# Patient Record
Sex: Male | Born: 2005 | Hispanic: Yes | Marital: Single | Smoking: Never smoker
Health system: Southern US, Community
[De-identification: ages and names within clinical notes are randomized; demographics above are authoritative.]

## PROBLEM LIST (undated history)

## (undated) DIAGNOSIS — Z91018 Allergy to other foods: Secondary | ICD-10-CM

## (undated) DIAGNOSIS — J302 Other seasonal allergic rhinitis: Secondary | ICD-10-CM

## (undated) DIAGNOSIS — F840 Autistic disorder: Secondary | ICD-10-CM

## (undated) HISTORY — DX: Allergy to other foods: Z91.018

---
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
## 2023-03-13 ENCOUNTER — Encounter: Payer: Self-pay | Admitting: Allergy

## 2023-03-13 ENCOUNTER — Ambulatory Visit (INDEPENDENT_AMBULATORY_CARE_PROVIDER_SITE_OTHER): Payer: MEDICAID | Admitting: Allergy

## 2023-03-13 ENCOUNTER — Other Ambulatory Visit: Payer: Self-pay

## 2023-03-13 VITALS — BP 112/72 | HR 69 | Temp 98.2°F | Resp 18 | Wt 146.5 lb

## 2023-03-13 DIAGNOSIS — H1013 Acute atopic conjunctivitis, bilateral: Secondary | ICD-10-CM | POA: Diagnosis not present

## 2023-03-13 DIAGNOSIS — T7800XD Anaphylactic reaction due to unspecified food, subsequent encounter: Secondary | ICD-10-CM | POA: Diagnosis not present

## 2023-03-13 DIAGNOSIS — J302 Other seasonal allergic rhinitis: Secondary | ICD-10-CM

## 2023-03-13 DIAGNOSIS — J3089 Other allergic rhinitis: Secondary | ICD-10-CM | POA: Diagnosis not present

## 2023-03-13 DIAGNOSIS — H6123 Impacted cerumen, bilateral: Secondary | ICD-10-CM

## 2023-03-13 MED ORDER — CROMOLYN SODIUM 4 % OP SOLN: 1.0000 [drp] | Freq: Four times a day (QID) | OPHTHALMIC | 5 refills | Status: DC | PRN

## 2023-03-13 MED ORDER — AZELASTINE-FLUTICASONE 137-50 MCG/ACT NA SUSP: 1.0000 | Freq: Two times a day (BID) | NASAL | 5 refills | Status: DC | PRN

## 2023-03-13 MED ORDER — CETIRIZINE HCL 5 MG/5ML PO SOLN: 10.0000 mg | Freq: Every day | ORAL | 5 refills | Status: DC

## 2023-03-13 NOTE — Patient Instructions (Addendum)
-  Continue avoidance measures for grasses, ragweed, weeds, trees, indoor molds, outdoor molds, dust mites, cat, dog, cockroach, and horse -Continue the following allergy  medications: Cetirizine  10mg  daily as needed Dymista  (fluticasone /azelastine ) 1 sprays per nostril 1-2 times daily as needed. Cromolyn  1 drop per eye up to 4 times a day as needed for itchy/watery eyes.  - You can use an extra dose of the antihistamine, if needed, for breakthrough symptoms.  - Consider nasal saline rinses 1-2 times daily to remove allergens from the nasal cavities as well as help with mucous clearance (this is especially helpful to do before the nasal sprays are given) - Consider allergy  shots as a means of long-term control. - Allergy  shots re-train and reset the immune system to ignore environmental allergens and decrease the resulting immune response to those allergens (sneezing, itchy watery eyes, runny nose, nasal congestion, etc).    - Allergy  shots improve symptoms in 75-85% of patients.  Let me know if this is an therapy you would like to start.  - Continue avoidance of all nuts.  Peanut and tree nut panel is positive.   - Have access to self-injectable epinephrine  (Epipen  or AuviQ) 0.3mg  at all times - Follow emergency action plan in case of allergic reaction  - Both ears are full of ear wax.  Recommend discussing with ear, nose and throat doctor about routing flushing/cleaning out the ears  Follow-up in 6 months or sooner if needed

## 2023-03-13 NOTE — Progress Notes (Signed)
 Follow-up Note  RE: Daniel Kent MRN: [id number] DOB: [birth year] Date of Office Visit: 03/13/2023   History of present illness: Daniel Kent is a 18 y.o. male presenting today for follow-up of allergic rhinitis with conjunctivitis and food allergy .  He was last seen in the office on 10/17/2022 by myself. Discussed the use of AI scribe software for clinical note transcription with the patient, who gave verbal consent to proceed.  He has a history of allergies to pollens, molds, dust mites, animals, and cockroaches, confirmed by previous testing. During pollen season, he experiences significant symptoms including nasal congestion and mouth breathing, which impact his daily life. Cetirizine  has been effective in managing his symptoms, whereas levocetirizine and montelukast  were not helpful.  He uses Dymista  nasal spray, which alleviates his nasal symptoms. He prefers the liquid form of cetirizine  as it does not cause drowsiness.  Cetirizine  makes him sleepy when taken in the morning, so he prefers taking it at night.  He has not received the prescribed eye drops due to pharmacy issues.  He avoids all nuts and has not needed to use his EpiPen .  He has been advised by another doctor to consider surgery for a deviated septum due to mouth breathing.  Mother mentions when he does do a lot of mouth breathing that he has bad breath. He has not yet discussed this with an ENT specialist.     Review of systems: 10pt ROS negative unless noted above in HPI  All other systems negative unless noted above in HPI  Past medical/social/surgical/family history have been reviewed and are unchanged unless specifically indicated below.  No changes  Medication List: Current Outpatient Medications  Medication Sig Dispense Refill   Azelastine -Fluticasone  (DYMISTA ) 137-50 MCG/ACT SUSP Place 1 spray into the nose 2 (two) times daily as needed (runny or stuffy nose). 23 g 5   EPINEPHrine  0.3 mg/0.3 mL IJ SOAJ  injection INJECT IN THE MUSCLE AS NEEDED FOR ANAPHYLAXIS 2 each 2   ibuprofen  (CHILDRENS MOTRIN ) 100 MG/5ML suspension Take 17.4 mLs (348 mg total) by mouth every 6 (six) hours as needed for fever, mild pain or moderate pain. 237 mL 0   fluticasone  (FLONASE ) 50 MCG/ACT nasal spray Place 2 sprays into both nostrils daily. (Patient not taking: Reported on 03/13/2023) 16 g 6   No current facility-administered medications for this visit.     Known medication allergies: Allergies  Allergen Reactions   Peanut-Containing Drug Products     REACTION: severity being evaluated     Physical examination: Blood pressure 112/72, pulse 69, temperature 98.2 F (36.8 C), temperature source Temporal, resp. rate 18, weight 146 lb 8 oz (66.5 kg), SpO2 98%.  General: Alert, interactive, in no acute distress. HEENT: PERRLA, TMs pearly gray, turbinates minimally edematous without discharge, post-pharynx non erythematous. Neck: Supple without lymphadenopathy. Lungs: Clear to auscultation without wheezing, rhonchi or rales. {no increased work of breathing. CV: Normal S1, S2 without murmurs. Abdomen: Nondistended, nontender. Skin: Warm and dry, without lesions or rashes. Extremities:  No clubbing, cyanosis or edema. Neuro:   Grossly intact.  Diagnositics/Labs: None today  Assessment and plan: Allergic rhinitis with conjunctivitis -Continue avoidance measures for grasses, ragweed, weeds, trees, indoor molds, outdoor molds, dust mites, cat, dog, cockroach, and horse -Continue the following allergy  medications: Cetirizine  10mg  daily as needed Dymista  (fluticasone /azelastine ) 1 sprays per nostril 1-2 times daily as needed. Cromolyn  1 drop per eye up to 4 times a day as needed for itchy/watery eyes.  - You can use  an extra dose of the antihistamine, if needed, for breakthrough symptoms.  - Consider nasal saline rinses 1-2 times daily to remove allergens from the nasal cavities as well as help with mucous  clearance (this is especially helpful to do before the nasal sprays are given) - Consider allergy  shots as a means of long-term control. - Allergy  shots re-train and reset the immune system to ignore environmental allergens and decrease the resulting immune response to those allergens (sneezing, itchy watery eyes, runny nose, nasal congestion, etc).    - Allergy  shots improve symptoms in 75-85% of patients.  Let me know if this is an therapy you would like to start.  Food allergy  - Continue avoidance of all nuts.  Peanut and tree nut panel is positive.   - Have access to self-injectable epinephrine  (Epipen  or AuviQ) 0.3mg  at all times - Follow emergency action plan in case of allergic reaction  Cerumen impaction - Both ears are full of ear wax.  Recommend discussing with ear, nose and throat doctor about routing flushing/cleaning out the ears  Follow-up in 6 months or sooner if needed  I appreciate the opportunity to take part in Daniel Kent's care. Please do not hesitate to contact me with questions.  Sincerely,   Danita Brain, MD Allergy /Immunology [facility]

## 2023-06-03 NOTE — Progress Notes (Signed)
    SUBJECTIVE:   Chief compliant/HPI: annual examination  Daniel Kent is a 18 y.o. who presents today for an annual exam.   Allergic rhinitis Severe symptoms, sees both allergy  and ENT.  Taking Zyrtec , Dymista  and Cromalyn nasal spray daily but does miss some days occasionally.  Concerned about surgery versus immunotherapy injections  Social: -In 32 - studying EE -Lives with mother -Not sexually active  Not seeing eye doctor.  Denies symptoms of blurry vision Dentist every 6 months. Brushing twice per day.   Reviewed and updated history.    OBJECTIVE:   BP (!) 100/58   Pulse 78   Wt 140 lb (63.5 kg)   SpO2 98%   BMI 23.30 kg/m   General: Well-appearing. Alert. NAD HEENT: Normocephalic. White sclera. TM clear bilaterally. No rhinorrhea or congestion.  CV: RRR without murmur Pulm: CTAB. Normal WOB on RA. No wheezing Abdomen: Soft, non-tender, non-distended. +BS Ext: Well perfused. Cap refill < 3 seconds Skin: Warm, dry. No rashes noted   ASSESSMENT/PLAN:   Assessment & Plan Non-seasonal allergic rhinitis, unspecified trigger Severe symptoms likely triggered by underlying allergies.  Seen by ENT and allergy , considering immunotherapy vs surgical correction of deviated septum.  Given allergic component of symptoms highly recommend trialing immunotherapy given risk with surgery.  Continue current regimen of cetirizine , Dymista  and cromolyn .  Annual Examination  See AVS for age appropriate recommendations  PHQ score 0, reviewed and discussed.  Blood pressure reviewed and at goal.    Considered the following items based upon USPSTF recommendations: HIV testing: declined Hepatitis C: declined Hepatitis B: declined Syphilis if at high risk: {declined GC/CTdeclined Lipid panel (nonfasting or fasting) discussed based upon AHA recommendations and not ordered.  Consider repeat every 4-6 years.  Reviewed risk factors for latent tuberculosis and not  indicated Immunizations: UTD   Follow up in 1 year or sooner if indicated.  Signed up today  Jonne Netters, MD [facility]

## 2023-06-05 ENCOUNTER — Ambulatory Visit (INDEPENDENT_AMBULATORY_CARE_PROVIDER_SITE_OTHER): Payer: MEDICAID | Admitting: Family Medicine

## 2023-06-05 ENCOUNTER — Encounter: Payer: Self-pay | Admitting: Family Medicine

## 2023-06-05 VITALS — BP 100/58 | HR 78 | Wt 140.0 lb

## 2023-06-05 DIAGNOSIS — J3089 Other allergic rhinitis: Secondary | ICD-10-CM

## 2023-06-05 DIAGNOSIS — Z0000 Encounter for general adult medical examination without abnormal findings: Secondary | ICD-10-CM

## 2023-06-05 NOTE — Patient Instructions (Signed)
 It was wonderful to see you today! Thank you for choosing [facility].   Please bring ALL of your medications with you to every visit.   Today we talked about:  I would recommend you go back to see the allergist to discuss immunotherapy shots as this may be the best thing to help with your symptoms.  If this does not work then I would consider the surgical option but I would first try medication management.  Please continue to take your allergy  medications every day. Please continue to focus on eating a healthy balanced diet including protein, fruits and vegetables.  Recommend increasing her exercise to 3-4 times weekly but I am glad you are getting at least 2 days and at the gym a week.  Please follow up in 1 year  If you haven't already, sign up for My Chart to have easy access to your labs results, and communication with your primary care physician.    Call the clinic at [phone] if your symptoms worsen or you have any concerns.  Please be sure to schedule follow up at the front desk before you leave today.   Jonne Netters, DO Family Medicine

## 2023-06-05 NOTE — Assessment & Plan Note (Signed)
 Severe symptoms likely triggered by underlying allergies.  Seen by ENT and allergy , considering immunotherapy vs surgical correction of deviated septum.  Given allergic component of symptoms highly recommend trialing immunotherapy given risk with surgery.  Continue current regimen of cetirizine , Dymista  and cromolyn .

## 2023-09-11 ENCOUNTER — Ambulatory Visit: Payer: MEDICAID | Admitting: Allergy

## 2023-11-02 ENCOUNTER — Other Ambulatory Visit: Payer: Self-pay | Admitting: Allergy

## 2023-11-02 ENCOUNTER — Other Ambulatory Visit: Payer: Self-pay | Admitting: Family Medicine

## 2023-11-02 NOTE — Telephone Encounter (Signed)
 Refills for Dynmista x 1 with 4 refills sent to [facility]/[place].

## 2024-02-05 ENCOUNTER — Ambulatory Visit: Payer: MEDICAID | Admitting: Allergy

## 2024-02-10 ENCOUNTER — Encounter: Payer: Self-pay | Admitting: Allergy

## 2024-02-10 ENCOUNTER — Ambulatory Visit (INDEPENDENT_AMBULATORY_CARE_PROVIDER_SITE_OTHER): Payer: MEDICAID | Admitting: Allergy

## 2024-02-10 VITALS — BP 108/68 | HR 60 | Ht 65.0 in | Wt 153.0 lb

## 2024-02-10 DIAGNOSIS — J302 Other seasonal allergic rhinitis: Secondary | ICD-10-CM

## 2024-02-10 DIAGNOSIS — T7800XD Anaphylactic reaction due to unspecified food, subsequent encounter: Secondary | ICD-10-CM

## 2024-02-10 DIAGNOSIS — T7020XD Unspecified effects of high altitude, subsequent encounter: Secondary | ICD-10-CM | POA: Diagnosis not present

## 2024-02-10 DIAGNOSIS — H1013 Acute atopic conjunctivitis, bilateral: Secondary | ICD-10-CM | POA: Diagnosis not present

## 2024-02-10 DIAGNOSIS — R0602 Shortness of breath: Secondary | ICD-10-CM | POA: Diagnosis not present

## 2024-02-10 DIAGNOSIS — J3089 Other allergic rhinitis: Secondary | ICD-10-CM

## 2024-02-10 MED ORDER — ALBUTEROL SULFATE HFA 108 (90 BASE) MCG/ACT IN AERS: INHALATION_SPRAY | RESPIRATORY_TRACT | 2 refills | Status: AC

## 2024-02-10 MED ORDER — AZELASTINE-FLUTICASONE 137-50 MCG/ACT NA SUSP: NASAL | 4 refills | Status: AC

## 2024-02-10 MED ORDER — CETIRIZINE HCL 5 MG/5ML PO SOLN: 10.0000 mg | Freq: Every day | ORAL | 5 refills | Status: AC

## 2024-02-10 MED ORDER — CROMOLYN SODIUM 4 % OP SOLN: 1.0000 [drp] | Freq: Four times a day (QID) | OPHTHALMIC | 5 refills | Status: AC | PRN

## 2024-02-10 NOTE — Progress Notes (Signed)
 "   Follow-up Note  RE: Taven Strite MRN: [id number] DOB: 19-Dec-2005 Date of Office Visit: 02/10/2024   History of present illness: Daniel Kent is a 19 y.o. male presenting today for follow-up of allergic rhinitis with conjunctivitis, food allergy .  He was last seen in the office on 03/13/23 by myself.  He presents today with his mother.   Discussed the use of AI scribe software for clinical note transcription with the patient, who gave verbal consent to proceed.  He experienced breathing difficulties while in [place], attributed to high altitude. Symptoms included dizziness and shortness of breath due to the point that he was taken to a doctor while there.  Mother states he had low oxygen levels in the office and he received breathing treatments. During this time, he was prescribed an inhaler, likely albuterol , which he used as needed for about every three hours daily over a 15-day period. Since returning, he has not needed to use the inhaler.  He has no history of asthma.  They do have a planned trip back to [place] for about a month over the summer.  He continues to take cetirizine  daily to manage symptoms of runny nose, stuffy nose, and congestion. An attempt to stop cetirizine  15 days ago resulted in increased nasal symptoms.  He has a history of nut allergies and has not had any recent reactions requiring the use of an EpiPen . He continues to avoid nuts.  no recent allergic reactions requiring an EpiPen .    Review of systems: 10pt ROS negative unless noted above in HPI  Past medical/social/surgical/family history have been reviewed and are unchanged unless specifically indicated below.  No changes  Medication List: Current Outpatient Medications  Medication Sig Dispense Refill   Azelastine -Fluticasone  (DYMISTA ) 137-50 MCG/ACT SUSP USE 1 SPRAY NASALLY TWICE DAILY AS NEEDED FOR RUNNY OR STUFFY NOSE 23 g 4   cetirizine  HCl (ZYRTEC ) 5 MG/5ML SOLN Take 10 mLs (10 mg total) by mouth  daily. 473 mL 5   cromolyn  (OPTICROM ) 4 % ophthalmic solution Place 1 drop into both eyes 4 (four) times daily as needed (itchy or watery eyes). 10 mL 5   EPINEPHrine  0.3 mg/0.3 mL IJ SOAJ injection INJECT IN THE MUSCLE AS NEEDED FOR ANAPHYLAXIS 2 each 2   ibuprofen  (CHILDRENS MOTRIN ) 100 MG/5ML suspension Take 17.4 mLs (348 mg total) by mouth every 6 (six) hours as needed for fever, mild pain or moderate pain. 237 mL 0   No current facility-administered medications for this visit.     Known medication allergies: Allergies[1]   Physical examination: Blood pressure 108/68, pulse 60, height 5' 5 (1.651 m), weight 153 lb (69.4 kg), SpO2 98%.  General: Alert, interactive, in no acute distress. HEENT: PERRLA, TMs pearly gray, turbinates non-edematous without discharge, post-pharynx non erythematous. Neck: Supple without lymphadenopathy. Lungs: Clear to auscultation without wheezing, rhonchi or rales. {no increased work of breathing. CV: Normal S1, S2 without murmurs. Abdomen: Nondistended, nontender. Skin: Warm and dry, without lesions or rashes. Extremities:  No clubbing, cyanosis or edema. Neuro:   Grossly intact.  Diagnostics/Labs:  Spirometry: FEV1: 2.22L 60%, FVC: 4.72L 112%, ratio consistent with obstructive pattern.  Patient's first ever spirometry attempt and effort performing the test was variable with each attempt patient of note patient does have history of autism  Assessment and plan: Allergic rhinitis with conjunctivitis -Continue avoidance measures for grasses, ragweed, weeds, trees, indoor molds, outdoor molds, dust mites, cat, dog, cockroach, and horse -Continue the following allergy  medications: Cetirizine  10mg  daily as  needed Dymista  (fluticasone /azelastine ) 1 sprays per nostril 1-2 times daily as needed. Cromolyn  1 drop per eye up to 4 times a day as needed for itchy/watery eyes.  - You can use an extra dose of the antihistamine, if needed, for breakthrough  symptoms.  - Consider nasal saline rinses 1-2 times daily to remove allergens from the nasal cavities as well as help with mucous clearance (this is especially helpful to do before the nasal sprays are given) - Consider allergy  shots as a means of long-term control. - Allergy  shots re-train and reset the immune system to ignore environmental allergens and decrease the resulting immune response to those allergens (sneezing, itchy watery eyes, runny nose, nasal congestion, etc).    - Allergy  shots improve symptoms in 75-85% of patients.  Let me know if this is an therapy you would like to start.  Food allergy  - Continue avoidance of all nuts.  Peanut and tree nut panel is positive.   - Have access to self-injectable epinephrine  (Epipen  or AuviQ) 0.3mg  at all times - Follow emergency action plan in case of allergic reaction  Shortness of breath secondary to high-altitude - Have access to albuterol  inhaler 2 puffs every 4-6 hours as needed for cough/wheeze/shortness of breath/chest tightness.  May use 15-20 minutes prior to activity.   Monitor frequency of use.     Take albuterol  with you on your trip to [place]   Follow-up in 6 months or sooner if needed   I appreciate the opportunity to take part in [facility] care. Please do not hesitate to contact me with questions.  Sincerely,   Danita Brain, MD Allergy /Immunology [facility]      [1]  Allergies Allergen Reactions   Pollen Extract Shortness Of Breath   Other    Peanut-Containing Drug Products     REACTION: severity being evaluated   "

## 2024-02-10 NOTE — Patient Instructions (Addendum)
-  Continue avoidance measures for grasses, ragweed, weeds, trees, indoor molds, outdoor molds, dust mites, cat, dog, cockroach, and horse -Continue the following allergy  medications: Cetirizine  10mg  daily as needed Dymista  (fluticasone /azelastine ) 1 sprays per nostril 1-2 times daily as needed. Cromolyn  1 drop per eye up to 4 times a day as needed for itchy/watery eyes.  - You can use an extra dose of the antihistamine, if needed, for breakthrough symptoms.  - Consider nasal saline rinses 1-2 times daily to remove allergens from the nasal cavities as well as help with mucous clearance (this is especially helpful to do before the nasal sprays are given) - Consider allergy  shots as a means of long-term control. - Allergy  shots re-train and reset the immune system to ignore environmental allergens and decrease the resulting immune response to those allergens (sneezing, itchy watery eyes, runny nose, nasal congestion, etc).    - Allergy  shots improve symptoms in 75-85% of patients.  Let me know if this is an therapy you would like to start.  - Continue avoidance of all nuts.  Peanut and tree nut panel is positive.   - Have access to self-injectable epinephrine  (Epipen  or AuviQ) 0.3mg  at all times - Follow emergency action plan in case of allergic reaction  - Have access to albuterol  inhaler 2 puffs every 4-6 hours as needed for cough/wheeze/shortness of breath/chest tightness.  May use 15-20 minutes prior to activity.   Monitor frequency of use.     Take albuterol  with you on your trip to [place]   Follow-up in 6 months or sooner if needed

## 2024-02-18 NOTE — Progress Notes (Signed)
" ° ° °  SUBJECTIVE:   CHIEF COMPLAINT / HPI:   Discussed the use of AI scribe software for clinical note transcription with the patient, who gave verbal consent to proceed.  Autism spectrum disorder and associated functional impairment - Autism diagnosis with associated mental distress in crowded or challenging environments - Significant anxiety and distress when school parking lots are full, leading to lateness to class - Tendency to run impulsively when seeing cars, especially while carrying backpack and tools, raising safety concerns for caregiver - Request for handicap parking sticker to allow for safer, closer parking at school  PERTINENT  PMH / PSH: ASD  OBJECTIVE:   BP 121/65   Pulse 76   Ht 5' 5 (1.651 m)   Wt 156 lb 6.4 oz (70.9 kg)   SpO2 100%   BMI 26.03 kg/m    General: Alert, no apparent distress, well groomed HEENT: Normocephalic, atraumatic, moist mucus membranes, neck supple Respiratory: Normal respiratory effort GI: Non-distended Skin: No rashes, no jaundice Psych: Appropriately conversant during exam  ASSESSMENT/PLAN:   Assessment & Plan Asperger's disorder Requesting handicap placard due to safety concern from caregiver regarding parking to attend school.  Causing significant emotional distress per patient and caregiver report, handicap placard completed today.   Dr. Izetta Nap, DO [facility]     "

## 2024-02-19 ENCOUNTER — Ambulatory Visit (INDEPENDENT_AMBULATORY_CARE_PROVIDER_SITE_OTHER): Payer: MEDICAID | Admitting: Family Medicine

## 2024-02-19 ENCOUNTER — Encounter: Payer: Self-pay | Admitting: Family Medicine

## 2024-02-19 VITALS — BP 121/65 | HR 76 | Ht 65.0 in | Wt 156.4 lb

## 2024-02-19 DIAGNOSIS — F845 Asperger's syndrome: Secondary | ICD-10-CM

## 2024-02-19 NOTE — Assessment & Plan Note (Signed)
 Requesting handicap placard due to safety concern from caregiver regarding parking to attend school.  Causing significant emotional distress per patient and caregiver report, handicap placard completed today.

## 2024-02-19 NOTE — Patient Instructions (Signed)
 It was wonderful to see you today! Thank you for choosing [facility].   Please bring ALL of your medications with you to every visit.   Today we talked about:  I completed the form for the Placard please take that to the [facility] office to obtain the placard, it looks like he can get 1 or 2 if he is driving the 2 different vehicles.  Please use it sparingly and only when needed if Jaxn needs it.  Please follow up for annual physical as planned  Call the clinic at [phone] if your symptoms worsen or you have any concerns.  Please be sure to schedule follow up at the front desk before you leave today.   Izetta Nap, DO Family Medicine

## 2024-07-29 ENCOUNTER — Ambulatory Visit: Payer: MEDICAID | Admitting: Allergy
